# Patient Record
Sex: Male | Born: 1965 | Race: White | Hispanic: No | Marital: Married | Smoking: Never smoker
Health system: Southern US, Community
[De-identification: ages and names within clinical notes are randomized; demographics above are authoritative.]

---

## 2019-01-07 ENCOUNTER — Ambulatory Visit (HOSPITAL_BASED_OUTPATIENT_CLINIC_OR_DEPARTMENT_OTHER)
Admission: EM | Admit: 2019-01-07 | Discharge: 2019-01-08 | Disposition: A | Discharge status: Home / Self Care | Payer: Federal, State, Local not specified - PPO | Attending: Emergency Medicine | Admitting: Emergency Medicine

## 2019-01-07 ENCOUNTER — Emergency Department (HOSPITAL_BASED_OUTPATIENT_CLINIC_OR_DEPARTMENT_OTHER): Payer: Federal, State, Local not specified - PPO

## 2019-01-07 ENCOUNTER — Emergency Department (HOSPITAL_COMMUNITY): Payer: Federal, State, Local not specified - PPO | Admitting: Certified Registered"

## 2019-01-07 ENCOUNTER — Other Ambulatory Visit (HOSPITAL_BASED_OUTPATIENT_CLINIC_OR_DEPARTMENT_OTHER): Payer: Self-pay

## 2019-01-07 ENCOUNTER — Encounter (HOSPITAL_COMMUNITY)
Admission: EM | Disposition: A | Discharge status: Home / Self Care | Payer: Self-pay | Source: Home / Self Care | Attending: Emergency Medicine

## 2019-01-07 ENCOUNTER — Encounter (HOSPITAL_BASED_OUTPATIENT_CLINIC_OR_DEPARTMENT_OTHER): Payer: Self-pay | Admitting: Emergency Medicine

## 2019-01-07 ENCOUNTER — Other Ambulatory Visit: Payer: Self-pay

## 2019-01-07 ENCOUNTER — Other Ambulatory Visit (HOSPITAL_BASED_OUTPATIENT_CLINIC_OR_DEPARTMENT_OTHER): Payer: Self-pay | Admitting: Radiology

## 2019-01-07 DIAGNOSIS — Z1159 Encounter for screening for other viral diseases: Secondary | ICD-10-CM | POA: Insufficient documentation

## 2019-01-07 DIAGNOSIS — E7800 Pure hypercholesterolemia, unspecified: Secondary | ICD-10-CM | POA: Diagnosis not present

## 2019-01-07 DIAGNOSIS — T148XXA Other injury of unspecified body region, initial encounter: Secondary | ICD-10-CM | POA: Diagnosis present

## 2019-01-07 DIAGNOSIS — S61313A Laceration without foreign body of left middle finger with damage to nail, initial encounter: Secondary | ICD-10-CM | POA: Insufficient documentation

## 2019-01-07 DIAGNOSIS — S61412A Laceration without foreign body of left hand, initial encounter: Secondary | ICD-10-CM

## 2019-01-07 DIAGNOSIS — S61215A Laceration without foreign body of left ring finger without damage to nail, initial encounter: Secondary | ICD-10-CM | POA: Diagnosis not present

## 2019-01-07 DIAGNOSIS — S62663B Nondisplaced fracture of distal phalanx of left middle finger, initial encounter for open fracture: Secondary | ICD-10-CM | POA: Insufficient documentation

## 2019-01-07 DIAGNOSIS — X58XXXA Exposure to other specified factors, initial encounter: Secondary | ICD-10-CM | POA: Diagnosis not present

## 2019-01-07 DIAGNOSIS — S62639A Displaced fracture of distal phalanx of unspecified finger, initial encounter for closed fracture: Secondary | ICD-10-CM

## 2019-01-07 DIAGNOSIS — S61219A Laceration without foreign body of unspecified finger without damage to nail, initial encounter: Secondary | ICD-10-CM | POA: Diagnosis present

## 2019-01-07 HISTORY — PX: I & D EXTREMITY: SHX5045

## 2019-01-07 LAB — CBC WITH DIFFERENTIAL/PLATELET
Abs Immature Granulocytes: 0.02 10*3/uL (ref 0.00–0.07)
Basophils Absolute: 0 10*3/uL (ref 0.0–0.1)
Basophils Relative: 1 %
Eosinophils Absolute: 0.1 10*3/uL (ref 0.0–0.5)
Eosinophils Relative: 1 %
HCT: 41.9 % (ref 39.0–52.0)
Hemoglobin: 13.5 g/dL (ref 13.0–17.0)
Immature Granulocytes: 0 %
Lymphocytes Relative: 48 %
Lymphs Abs: 3.9 10*3/uL (ref 0.7–4.0)
MCH: 25.3 pg — ABNORMAL LOW (ref 26.0–34.0)
MCHC: 32.2 g/dL (ref 30.0–36.0)
MCV: 78.6 fL — ABNORMAL LOW (ref 80.0–100.0)
Monocytes Absolute: 0.6 10*3/uL (ref 0.1–1.0)
Monocytes Relative: 8 %
Neutro Abs: 3.4 10*3/uL (ref 1.7–7.7)
Neutrophils Relative %: 42 %
Platelets: 214 10*3/uL (ref 150–400)
RBC: 5.33 MIL/uL (ref 4.22–5.81)
RDW: 12.7 % (ref 11.5–15.5)
WBC: 8.1 10*3/uL (ref 4.0–10.5)
nRBC: 0 % (ref 0.0–0.2)

## 2019-01-07 LAB — COMPREHENSIVE METABOLIC PANEL
ALT: 28 U/L (ref 0–44)
AST: 31 U/L (ref 15–41)
Albumin: 5 g/dL (ref 3.5–5.0)
Alkaline Phosphatase: 34 U/L — ABNORMAL LOW (ref 38–126)
Anion gap: 11 (ref 5–15)
BUN: 27 mg/dL — ABNORMAL HIGH (ref 6–20)
CO2: 23 mmol/L (ref 22–32)
Calcium: 9.7 mg/dL (ref 8.9–10.3)
Chloride: 104 mmol/L (ref 98–111)
Creatinine, Ser: 1.26 mg/dL — ABNORMAL HIGH (ref 0.61–1.24)
GFR calc Af Amer: >60 mL/min (ref >60)
GFR calc non Af Amer: >60 mL/min (ref >60)
Glucose, Bld: 116 mg/dL — ABNORMAL HIGH (ref 70–99)
Potassium: 3.8 mmol/L (ref 3.5–5.1)
Sodium: 138 mmol/L (ref 135–145)
Total Bilirubin: 0.6 mg/dL (ref 0.3–1.2)
Total Protein: 7.7 g/dL (ref 6.5–8.1)

## 2019-01-07 LAB — SARS CORONAVIRUS 2 AG (30 MIN TAT): SARS Coronavirus 2 Ag: NEGATIVE

## 2019-01-07 SURGERY — IRRIGATION AND DEBRIDEMENT EXTREMITY
Anesthesia: General | Site: Hand | Laterality: Left

## 2019-01-07 MED ORDER — FENTANYL CITRATE (PF) 100 MCG/2ML IJ SOLN: 25.0000 ug | INTRAMUSCULAR | Status: DC | PRN

## 2019-01-07 MED ORDER — HYDROCODONE-ACETAMINOPHEN 5-325 MG PO TABS: ORAL_TABLET | ORAL | 0 refills | Status: AC

## 2019-01-07 MED ORDER — 0.9 % SODIUM CHLORIDE (POUR BTL) OPTIME
TOPICAL | Status: DC | PRN
  Administered 2019-01-07: 1000 mL

## 2019-01-07 MED ORDER — LACTATED RINGERS IV SOLN
INTRAVENOUS | Status: DC | PRN
  Administered 2019-01-07 (×2): via INTRAVENOUS

## 2019-01-07 MED ORDER — HYDROMORPHONE HCL 1 MG/ML IJ SOLN
1.0000 mg | Freq: Once | INTRAMUSCULAR | Status: AC
  Administered 2019-01-07: 1 mg via INTRAVENOUS

## 2019-01-07 MED ORDER — ONDANSETRON HCL 4 MG/2ML IJ SOLN
INTRAMUSCULAR | Status: AC
  Administered 2019-01-07: 4 mg via INTRAVENOUS
  Filled 2019-01-07: qty 2

## 2019-01-07 MED ORDER — PROPOFOL 10 MG/ML IV BOLUS
INTRAVENOUS | Status: DC | PRN
  Administered 2019-01-07: 170 mg via INTRAVENOUS

## 2019-01-07 MED ORDER — LIDOCAINE 2% (20 MG/ML) 5 ML SYRINGE
INTRAMUSCULAR | Status: DC | PRN
  Administered 2019-01-07: 100 mg via INTRAVENOUS

## 2019-01-07 MED ORDER — HYDROMORPHONE HCL 1 MG/ML IJ SOLN
INTRAMUSCULAR | Status: AC
  Administered 2019-01-07: 1 mg via INTRAVENOUS
  Filled 2019-01-07: qty 1

## 2019-01-07 MED ORDER — SODIUM CHLORIDE 0.9 % IV BOLUS
500.0000 mL | Freq: Once | INTRAVENOUS | Status: AC
  Administered 2019-01-07: 500 mL via INTRAVENOUS

## 2019-01-07 MED ORDER — BUPIVACAINE HCL (PF) 0.5 % IJ SOLN
30.0000 mL | Freq: Once | INTRAMUSCULAR | Status: AC
  Administered 2019-01-07: 30 mL
  Filled 2019-01-07: qty 30

## 2019-01-07 MED ORDER — BUPIVACAINE HCL (PF) 0.25 % IJ SOLN
INTRAMUSCULAR | Status: DC | PRN
  Administered 2019-01-07: 10 mL

## 2019-01-07 MED ORDER — DEXAMETHASONE SODIUM PHOSPHATE 10 MG/ML IJ SOLN
INTRAMUSCULAR | Status: DC | PRN
  Administered 2019-01-07: 10 mg via INTRAVENOUS

## 2019-01-07 MED ORDER — SUFENTANIL CITRATE 50 MCG/ML IV SOLN
INTRAVENOUS | Status: DC | PRN
  Administered 2019-01-07: 10 ug via INTRAVENOUS

## 2019-01-07 MED ORDER — ONDANSETRON HCL 4 MG/2ML IJ SOLN
4.0000 mg | Freq: Once | INTRAMUSCULAR | Status: AC
  Administered 2019-01-07: 4 mg via INTRAVENOUS

## 2019-01-07 MED ORDER — ONDANSETRON HCL 4 MG/2ML IJ SOLN
INTRAMUSCULAR | Status: DC | PRN
  Administered 2019-01-07: 4 mg via INTRAVENOUS

## 2019-01-07 MED ORDER — CEFAZOLIN SODIUM-DEXTROSE 2-4 GM/100ML-% IV SOLN
2.0000 g | Freq: Once | INTRAVENOUS | Status: DC
  Filled 2019-01-07: qty 100

## 2019-01-07 MED ORDER — MIDAZOLAM HCL 5 MG/5ML IJ SOLN
INTRAMUSCULAR | Status: DC | PRN
  Administered 2019-01-07: 2 mg via INTRAVENOUS

## 2019-01-07 MED ORDER — CEFAZOLIN SODIUM-DEXTROSE 2-3 GM-%(50ML) IV SOLR
INTRAVENOUS | Status: DC | PRN
  Administered 2019-01-07: 2 g via INTRAVENOUS

## 2019-01-07 MED ORDER — BUPIVACAINE HCL (PF) 0.25 % IJ SOLN
INTRAMUSCULAR | Status: AC
  Filled 2019-01-07: qty 30

## 2019-01-07 MED ORDER — SUCCINYLCHOLINE CHLORIDE 200 MG/10ML IV SOSY
PREFILLED_SYRINGE | INTRAVENOUS | Status: DC | PRN
  Administered 2019-01-07: 140 mg via INTRAVENOUS

## 2019-01-07 MED ORDER — CEFAZOLIN SODIUM-DEXTROSE 1-4 GM/50ML-% IV SOLN
1.0000 g | Freq: Once | INTRAVENOUS | Status: AC
  Administered 2019-01-07: 1 g via INTRAVENOUS
  Filled 2019-01-07: qty 50

## 2019-01-07 MED ORDER — SULFAMETHOXAZOLE-TRIMETHOPRIM 800-160 MG PO TABS: 1.0000 | ORAL_TABLET | Freq: Two times a day (BID) | ORAL | 0 refills | Status: AC

## 2019-01-07 SURGICAL SUPPLY — 53 items
BANDAGE ACE 3X5.8 VEL STRL LF (GAUZE/BANDAGES/DRESSINGS) ×3 IMPLANT
BANDAGE ACE 4X5 VEL STRL LF (GAUZE/BANDAGES/DRESSINGS) ×3 IMPLANT
BANDAGE COBAN STERILE 2 (GAUZE/BANDAGES/DRESSINGS) IMPLANT
BANDAGE ELASTIC 3 VELCRO ST LF (GAUZE/BANDAGES/DRESSINGS) IMPLANT
BANDAGE ELASTIC 4 VELCRO ST LF (GAUZE/BANDAGES/DRESSINGS) IMPLANT
BNDG COHESIVE 1X5 TAN STRL LF (GAUZE/BANDAGES/DRESSINGS) ×6 IMPLANT
BNDG ESMARK 4X9 LF (GAUZE/BANDAGES/DRESSINGS) ×3 IMPLANT
BNDG GAUZE ELAST 4 BULKY (GAUZE/BANDAGES/DRESSINGS) ×3 IMPLANT
CORDS BIPOLAR (ELECTRODE) ×3 IMPLANT
COVER SURGICAL LIGHT HANDLE (MISCELLANEOUS) ×3 IMPLANT
COVER WAND RF STERILE (DRAPES) ×3 IMPLANT
CUFF TOURNIQUET SINGLE 18IN (TOURNIQUET CUFF) IMPLANT
CUFF TOURNIQUET SINGLE 24IN (TOURNIQUET CUFF) ×3 IMPLANT
DECANTER SPIKE VIAL GLASS SM (MISCELLANEOUS) ×3 IMPLANT
DRAIN PENROSE 1/4X12 LTX STRL (WOUND CARE) IMPLANT
DRSG PAD ABDOMINAL 8X10 ST (GAUZE/BANDAGES/DRESSINGS) ×6 IMPLANT
GAUZE SPONGE 4X4 12PLY STRL (GAUZE/BANDAGES/DRESSINGS) ×3 IMPLANT
GAUZE SPONGE 4X4 12PLY STRL LF (GAUZE/BANDAGES/DRESSINGS) ×3 IMPLANT
GAUZE XEROFORM 1X8 LF (GAUZE/BANDAGES/DRESSINGS) ×3 IMPLANT
GLOVE BIO SURGEON STRL SZ7.5 (GLOVE) ×3 IMPLANT
GLOVE BIOGEL PI IND STRL 8 (GLOVE) ×1 IMPLANT
GLOVE BIOGEL PI INDICATOR 8 (GLOVE) ×2
GOWN STRL REUS W/ TWL LRG LVL3 (GOWN DISPOSABLE) ×1 IMPLANT
GOWN STRL REUS W/ TWL XL LVL3 (GOWN DISPOSABLE) ×1 IMPLANT
GOWN STRL REUS W/TWL LRG LVL3 (GOWN DISPOSABLE) ×2
GOWN STRL REUS W/TWL XL LVL3 (GOWN DISPOSABLE) ×2
KIT BASIN OR (CUSTOM PROCEDURE TRAY) ×3 IMPLANT
KIT TURNOVER KIT B (KITS) ×3 IMPLANT
LOOP VESSEL MAXI BLUE (MISCELLANEOUS) IMPLANT
MANIFOLD NEPTUNE II (INSTRUMENTS) IMPLANT
NEEDLE HYPO 25X1 1.5 SAFETY (NEEDLE) ×3 IMPLANT
NS IRRIG 1000ML POUR BTL (IV SOLUTION) ×3 IMPLANT
PACK ORTHO EXTREMITY (CUSTOM PROCEDURE TRAY) ×3 IMPLANT
PAD ARMBOARD 7.5X6 YLW CONV (MISCELLANEOUS) ×6 IMPLANT
SCRUB BETADINE 4OZ XXX (MISCELLANEOUS) ×3 IMPLANT
SET CYSTO W/LG BORE CLAMP LF (SET/KITS/TRAYS/PACK) IMPLANT
SOL PREP POV-IOD 4OZ 10% (MISCELLANEOUS) ×3 IMPLANT
SPLINT FINGER (SOFTGOODS) ×6 IMPLANT
SPONGE LAP 18X18 X RAY DECT (DISPOSABLE) ×3 IMPLANT
SPONGE LAP 4X18 RFD (DISPOSABLE) IMPLANT
SUT CHROMIC 6 0 PS 4 (SUTURE) ×3 IMPLANT
SUT ETHILON 4 0 P 3 18 (SUTURE) IMPLANT
SUT ETHILON 4 0 PS 2 18 (SUTURE) IMPLANT
SUT MON AB 5-0 P3 18 (SUTURE) ×6 IMPLANT
SWAB COLLECTION DEVICE MRSA (MISCELLANEOUS) IMPLANT
SWAB CULTURE ESWAB REG 1ML (MISCELLANEOUS) IMPLANT
SYR CONTROL 10ML LL (SYRINGE) ×3 IMPLANT
TOWEL OR 17X26 10 PK STRL BLUE (TOWEL DISPOSABLE) ×3 IMPLANT
TUBE CONNECTING 12'X1/4 (SUCTIONS) ×1
TUBE CONNECTING 12X1/4 (SUCTIONS) ×2 IMPLANT
TUBE FEEDING ENTERAL 5FR 16IN (TUBING) IMPLANT
UNDERPAD 30X30 (UNDERPADS AND DIAPERS) ×3 IMPLANT
YANKAUER SUCT BULB TIP NO VENT (SUCTIONS) ×3 IMPLANT

## 2019-01-07 NOTE — H&P (Addendum)
Tayvon Culley is an 53 y.o. male.   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  Reports no previous injury to left hand and no other injury at this time.  Had throbbing pain initially worsened with palpation and motion and relieved by digital block by ED.  Associated bleeding from wounds.  Case discussed with Dr. Judd Lien and his note from 01/07/2019 reviewed. Xrays viewed and interpreted by me: 3 views left hand show tuft fracture long finger.  No other fracture, dislocation, radioopaque foreign body noted. Labs reviewed: none  Allergies: No Known Allergies  No past medical history on file.  PMH: hypercholesterolemia Meds: fenofibrate, vitamin D, fish oil  Family History: No family history on file.  Social History:   reports that he has never smoked. He has never used smokeless tobacco. No history on file for alcohol and drug.  Medications: (Not in a hospital admission)   Results for orders placed or performed during the hospital encounter of 01/07/19 (from the past 48 hour(s))  CBC with Differential     Status: Abnormal   Collection Time: 01/07/19  5:20 PM  Result Value Ref Range   WBC 8.1 4.0 - 10.5 K/uL   RBC 5.33 4.22 - 5.81 MIL/uL   Hemoglobin 13.5 13.0 - 17.0 g/dL   HCT 16.1 09.6 - 04.5 %   MCV 78.6 (L) 80.0 - 100.0 fL   MCH 25.3 (L) 26.0 - 34.0 pg   MCHC 32.2 30.0 - 36.0 g/dL   RDW 40.9 81.1 - 91.4 %   Platelets 214 150 - 400 K/uL   nRBC 0.0 0.0 - 0.2 %   Neutrophils Relative % 42 %   Neutro Abs 3.4 1.7 - 7.7 K/uL   Lymphocytes Relative 48 %   Lymphs Abs 3.9 0.7 - 4.0 K/uL   Monocytes Relative 8 %   Monocytes Absolute 0.6 0.1 - 1.0 K/uL   Eosinophils Relative 1 %   Eosinophils Absolute 0.1 0.0 - 0.5 K/uL   Basophils Relative 1 %   Basophils Absolute 0.0 0.0 - 0.1 K/uL   Immature Granulocytes 0 %   Abs Immature Granulocytes 0.02 0.00 - 0.07 K/uL    Comment:  Performed at [facility], [address] [place], [place] [address]  Comprehensive metabolic panel     Status: Abnormal   Collection Time: 01/07/19  5:20 PM  Result Value Ref Range   Sodium 138 135 - 145 mmol/L   Potassium 3.8 3.5 - 5.1 mmol/L   Chloride 104 98 - 111 mmol/L   CO2 23 22 - 32 mmol/L   Glucose, Bld 116 (H) 70 - 99 mg/dL   BUN 27 (H) 6 - 20 mg/dL   Creatinine, Ser 6.21 (H) 0.61 - 1.24 mg/dL   Calcium 9.7 8.9 - 30.8 mg/dL   Total Protein 7.7 6.5 - 8.1 g/dL   Albumin 5.0 3.5 - 5.0 g/dL   AST 31 15 - 41 U/L   ALT 28 0 - 44 U/L   Alkaline Phosphatase 34 (L) 38 - 126 U/L   Total Bilirubin 0.6 0.3 - 1.2 mg/dL   GFR calc non Af Amer >60 >60 mL/min   GFR calc Af Amer >60 >60 mL/min   Anion gap 11 5 - 15    Comment: Performed at [facility], [address] [place], [place] [address]  SARS Coronavirus 2 [facility]  order,Performed in [facility] lab via [employer] [facility])     Status: None   Collection Time: 01/07/19  7:38 PM  Result Value Ref Range   SARS Coronavirus 2 ([facility] Now) NEGATIVE NEGATIVE    Comment: (NOTE) Interpretive Result Comment(s): COVID 19 Positive SARS CoV 2 target nucleic acids are DETECTED. The SARS CoV 2 RNA is generally detectable in upper and lower respiratory specimens during the acute phase of infection.  Positive results are indicative of active infection with SARS CoV 2.  Clinical correlation with patient history and other diagnostic information is necessary to determine patient infection status.  Positive results do not rule out bacterial infection or coinfection with other viruses. The expected result is Negative. COVID 19 Negative SARS CoV 2 target nucleic acids are NOT DETECTED. The SARS CoV 2 RNA is generally detectable in upper and lower respiratory specimens during the acute phase of infection.  Negative results do not preclude SARS CoV 2 infection, do not rule out coinfections with other pathogens, and should not be used  as the sole basis for treatment or other patient management decisions.  Negative results must be combined with clinical  observations, patient history, and epidemiological information. The expected result is Negative. Invalid Presence or absence of SARS CoV 2 nucleic acids cannot be determined. Repeat testing was performed on the submitted specimen and repeated Invalid results were obtained.  If clinically indicated, additional testing on a new specimen with an alternate test methodology [phone] is advised.  The SARS CoV 2 RNA is generally detectable in upper and lower respiratory specimens during the acute phase of infection. The expected result is Negative. Fact Sheet for Patients:  http://www.graves-ford.org/https://www.fda.gov/media/136524/download Fact Sheet for Healthcare Providers: EnviroConcern.sihttps://www.fda.gov/media/136523/download This test is not yet approved or cleared by the [place] FDA and has been authorized for detection and/or diagnosis of SARS CoV 2 by FDA under an Emergency Use Authorization (EUA).  This EUA will remain in effect (meaning this test can be used) for the duration of the COVID19 d eclaration under Section 564(b)(1) of the Act, 21 U.S.C. section [phone] 3(b)(1), unless the authorization is terminated or revoked sooner. Performed at [facility] [place], [address] [place], [place] [address]     Dg Hand Complete Left  Result Date: 01/07/2019 CLINICAL DATA:  53 year old male status post lawnmower injury to left hand. EXAM: LEFT HAND - COMPLETE 3+ VIEW COMPARISON:  None. FINDINGS: Soft tissue injuries at the distal 3rd and 4th fingers, partial amputation at the latter. There is a nondisplaced fracture through the tuft of the 3rd distal phalanx. However, the 4th distal phalanx appears to remain intact. No radiopaque foreign body identified. Other visible left hand osseous structures appear intact and normal. IMPRESSION: 1. Nondisplaced fracture through the tuft of the 3rd distal  phalanx. 2. Soft tissue injury to the distal 3rd and 4th fingers. Electronically Signed   By: Odessa FlemingH  Hall M.D.   On: 01/07/2019 18:57     A comprehensive review of systems was negative. Review of Systems: No fevers, chills, night sweats, chest pain, shortness of breath, nausea, vomiting, diarrhea, constipation, easy bleeding or bruising, headaches, dizziness, vision changes, fainting.   Blood pressure 120/83, pulse 94, temperature 97.9 F (36.6 C), temperature source Oral, resp. rate 18, height 5\' 11"  (1.803 m), weight 88.5 kg, SpO2 100 %.  General appearance: alert, cooperative and appears stated age Head: Normocephalic, without obvious abnormality, atraumatic Neck: supple, symmetrical, trachea midline Resp: clear to auscultation bilaterally Cardio: regular rate  and rhythm Extremities: Intact sensation and capillary refill all digits except left long and ring with decreased sensation due to digital block.  +epl/fpl/io.  Lacerations to hyponychium and pad of left long and ring fingers. Able to flex at dip joint. Pulses: 2+ and symmetric Skin: Skin color, texture, turgor normal. No rashes or lesions Neurologic: Grossly normal Incision/Wound: as above  Assessment/Plan Left long and ring finger lawnmower injury with lacerations of pad and nail.  Small tuft fracture long finger.  Recommend OR for irrigation and debridement of wounds with repair of skin and nailbed lacerations.  Discussed possible need to shorten bone if bone exposed and unable to get coverage with soft tissue.  Risks, benefits and alternatives of surgery were discussed including risks of blood loss, infection, damage to nerves/vessels/tendons/ligament/bone, failure of surgery, need for additional surgery, complication with wound healing, stiffness, necrosis of skin.  He voiced understanding of these risks and elected to proceed.    Betha Loa 01/07/2019, 9:39 PM

## 2019-01-07 NOTE — Transfer of Care (Signed)
Immediate Anesthesia Transfer of Care Note  Patient: Henry Savage  Procedure(s) Performed: IRRIGATION AND DEBRIDEMENT LEFT LONG AND RING FINGER AND REPAIR WOUNDS (Left Hand)  Patient Location: PACU  Anesthesia Type:General  Level of Consciousness: sedated, drowsy, patient cooperative and responds to stimulation  Airway & Oxygen Therapy: Patient Spontanous Breathing and Patient connected to nasal cannula oxygen  Post-op Assessment: Report given to RN, Post -op Vital signs reviewed and stable and Patient moving all extremities X 4  Post vital signs: Reviewed and stable  Last Vitals:  Vitals Value Taken Time  BP 118/79 01/07/2019 11:04 PM  Temp    Pulse 76 01/07/2019 11:05 PM  Resp 8 01/07/2019 11:05 PM  SpO2 97 % 01/07/2019 11:05 PM  Vitals shown include unvalidated device data.  Last Pain:  Vitals:   01/07/19 2126  TempSrc:   PainSc: 8          Complications: No apparent anesthesia complications

## 2019-01-07 NOTE — Anesthesia Postprocedure Evaluation (Signed)
Anesthesia Post Note  Patient: Fish farm manager  Procedure(s) Performed: IRRIGATION AND DEBRIDEMENT LEFT LONG AND RING FINGER AND REPAIR WOUNDS (Left Hand)     Patient location during evaluation: PACU Anesthesia Type: General Level of consciousness: awake Pain management: pain level controlled Vital Signs Assessment: post-procedure vital signs reviewed and stable Respiratory status: spontaneous breathing Cardiovascular status: stable Postop Assessment: no apparent nausea or vomiting Anesthetic complications: no    Last Vitals:  Vitals:   01/07/19 2305 01/07/19 2315  BP: 118/79   Pulse: 76 95  Resp: (!) 8 (!) 22  Temp: (!) 36.3 C   SpO2: 97% 99%    Last Pain:  Vitals:   01/07/19 2315  TempSrc:   PainSc: 0-No pain                 Desirey Keahey

## 2019-01-07 NOTE — Anesthesia Preprocedure Evaluation (Signed)
Anesthesia Evaluation  Patient identified by MRN, date of birth, ID band Patient awake    Reviewed: Allergy & Precautions, NPO status , Patient's Chart, lab work & pertinent test results  Airway Mallampati: II  TM Distance: >3 FB     Dental   Pulmonary    breath sounds clear to auscultation       Cardiovascular negative cardio ROS   Rhythm:Regular Rate:Normal     Neuro/Psych    GI/Hepatic negative GI ROS, Neg liver ROS,   Endo/Other  negative endocrine ROS  Renal/GU negative Renal ROS     Musculoskeletal   Abdominal   Peds  Hematology   Anesthesia Other Findings   Reproductive/Obstetrics                             Anesthesia Physical Anesthesia Plan  ASA: II  Anesthesia Plan: General   Post-op Pain Management:    Induction: Intravenous  PONV Risk Score and Plan: 2 and Ondansetron and Midazolam  Airway Management Planned: Oral ETT  Additional Equipment:   Intra-op Plan:   Post-operative Plan: Extubation in OR  Informed Consent: I have reviewed the patients History and Physical, chart, labs and discussed the procedure including the risks, benefits and alternatives for the proposed anesthesia with the patient or authorized representative who has indicated his/her understanding and acceptance.     Dental advisory given  Plan Discussed with: CRNA and Anesthesiologist  Anesthesia Plan Comments:         Anesthesia Quick Evaluation

## 2019-01-07 NOTE — ED Provider Notes (Signed)
[facility] EMERGENCY DEPARTMENT Provider Note   CSN: [id number] Arrival date & time: 01/07/19  1655  History   Chief Complaint Chief Complaint  Patient presents with  . Hand Injury   HPI Henry Savage is a 53 y.o. male with past medical history significant for hyperlipidemia who presents for evaluation of hand injury.  Patient was fixing his lawnmower and stuck his left hand to the digits.  Lawnmower then subsequently turned on and he sustained 2 lacerations to his fourth and third digit on his left upper extremity.  Patient with mild oozing bleeding on arrival.  Rates his pain a 10/10.  Denies decreased range of motion to his extremities.  Denies any upper respiratory symptoms known COVID exposures.  He is not on anticoagulation.  Pain constant since onset.  Described as throbbing and sharp.  Tetanus last updated less than 2 years ago.  History obtained from patient.  No interpreter was used.     HPI  History reviewed. No pertinent past medical history.  There are no active problems to display for this patient.   History reviewed. No pertinent surgical history.      Home Medications    Prior to Admission medications   Medication Sig Start Date End Date Taking? Authorizing Provider  HYDROcodone-acetaminophen (NORCO) 5-325 MG tablet 1-2 tabs po q6 hours prn pain 01/07/19   Betha LoaKuzma, Kevin, MD  sulfamethoxazole-trimethoprim (BACTRIM DS) 800-160 MG tablet Take 1 tablet by mouth 2 (two) times daily. 01/07/19   Betha LoaKuzma, Kevin, MD    Family History History reviewed. No pertinent family history.  Social History Social History   Tobacco Use  . Smoking status: Never Smoker  . Smokeless tobacco: Never Used  Substance Use Topics  . Alcohol use: Not on file  . Drug use: Not on file     Allergies   Patient has no known allergies.   Review of Systems Review of Systems  Constitutional: Negative.   HENT: Negative.   Respiratory: Negative.   Cardiovascular: Negative.    Gastrointestinal: Negative.   Genitourinary: Negative.   Musculoskeletal: Negative.   Skin: Positive for wound.  Neurological: Negative.   All other systems reviewed and are negative.   Physical Exam Updated Vital Signs BP 118/79 (BP Location: Right Wrist)   Pulse 95   Temp (!) 97.3 F (36.3 C)   Resp (!) 22   Ht 5\' 11"  (1.803 m)   Wt 88.5 kg   SpO2 99%   BMI 27.20 kg/m   Physical Exam Vitals signs and nursing note reviewed.  Constitutional:      General: He is not in acute distress.    Appearance: He is well-developed. He is not ill-appearing, toxic-appearing or diaphoretic.  HENT:     Head: Normocephalic and atraumatic.     Nose: Nose normal.     Mouth/Throat:     Mouth: Mucous membranes are moist.     Pharynx: Oropharynx is clear.  Eyes:     Pupils: Pupils are equal, round, and reactive to light.  Neck:     Musculoskeletal: Normal range of motion and neck supple.  Cardiovascular:     Rate and Rhythm: Normal rate and regular rhythm.     Pulses: Normal pulses.     Heart sounds: Normal heart sounds. No murmur. No friction rub. No gallop.   Pulmonary:     Effort: Pulmonary effort is normal. No respiratory distress.     Breath sounds: Normal breath sounds. No stridor. No wheezing, rhonchi or  rales.  Abdominal:     General: Bowel sounds are normal. There is no distension.     Palpations: Abdomen is soft.     Tenderness: There is no abdominal tenderness. There is no guarding or rebound.     Hernia: No hernia is present.  Musculoskeletal: Normal range of motion.     Comments: Moves all 4 extremities without difficulty.  Moves digits without difficulty.  Patient with obvious injury to distal third and fourth digit on left upper extremity.  He has tenderness to his third and fourth digit on his left upper extremity.  Skin:    General: Skin is warm and dry.     Comments: Laceration to third and fourth digit on left upper extremity with majority of pulp missing.  There is  mild oozing of blood.  No drainage.  Possible visualization of bone to fourth digit.  No pallor.  There is nailbed involvement with some nail missing to third and fourth digit.  Neurological:     Mental Status: He is alert.     Comments: Sensation intact to bilateral upper extremities. 5/5 strength bilateral upper extremities.  Ambulatory without difficulty.              ED Treatments / Results  Labs (all labs ordered are listed, but only abnormal results are displayed) Labs Reviewed  CBC WITH DIFFERENTIAL/PLATELET - Abnormal; Notable for the following components:      Result Value   MCV 78.6 (*)    MCH 25.3 (*)    All other components within normal limits  COMPREHENSIVE METABOLIC PANEL - Abnormal; Notable for the following components:   Glucose, Bld 116 (*)    BUN 27 (*)    Creatinine, Ser 1.26 (*)    Alkaline Phosphatase 34 (*)    All other components within normal limits  SARS CORONAVIRUS 2 (HOSP ORDER, PERFORMED IN [facility] VIA [facility])    EKG None  Radiology Dg Hand Complete Left  Result Date: 01/07/2019 CLINICAL DATA:  53 year old male status post lawnmower injury to left hand. EXAM: LEFT HAND - COMPLETE 3+ VIEW COMPARISON:  None. FINDINGS: Soft tissue injuries at the distal 3rd and 4th fingers, partial amputation at the latter. There is a nondisplaced fracture through the tuft of the 3rd distal phalanx. However, the 4th distal phalanx appears to remain intact. No radiopaque foreign body identified. Other visible left hand osseous structures appear intact and normal. IMPRESSION: 1. Nondisplaced fracture through the tuft of the 3rd distal phalanx. 2. Soft tissue injury to the distal 3rd and 4th fingers. Electronically Signed   By: Odessa Fleming M.D.   On: 01/07/2019 18:57    Procedures .Nerve Block Date/Time: 01/07/2019 7:28 PM Performed by: Linwood Dibbles, PA-C Authorized by: Linwood Dibbles, PA-C   Consent:    Consent obtained:  Verbal   Consent given  by:  Patient   Risks discussed:  Infection, intravenous injection, nerve damage, pain, swelling, unsuccessful block and bleeding   Alternatives discussed:  Delayed treatment, alternative treatment, referral and no treatment Indications:    Indications:  Pain relief and procedural anesthesia Location:    Body area:  Upper extremity   Upper extremity nerve blocked: Digit.   Laterality:  Left Pre-procedure details:    Skin preparation:  Povidone-iodine   Preparation: Patient was prepped and draped in usual sterile fashion   Skin anesthesia (see MAR for exact dosages):    Skin anesthesia method:  None Procedure details (see MAR for  exact dosages):    Block needle gauge:  25 G   Anesthetic injected:  Bupivacaine 0.5% w/o epi   Steroid injected:  None   Additive injected:  None   Injection procedure:  Anatomic landmarks identified   Paresthesia:  Immediately resolved Post-procedure details:    Dressing:  Sterile dressing   Outcome:  Pain relieved   Patient tolerance of procedure:  Tolerated well, no immediate complications   (including critical care time)  Medications Ordered in ED Medications  fentaNYL (SUBLIMAZE) injection 25-50 mcg (has no administration in time range)  ondansetron (ZOFRAN) injection 4 mg (4 mg Intravenous Given 01/07/19 1720)  HYDROmorphone (DILAUDID) injection 1 mg (1 mg Intravenous Given 01/07/19 1720)  bupivacaine (MARCAINE) 0.5 % injection 30 mL (30 mLs Infiltration Given 01/07/19 1943)  sodium chloride 0.9 % bolus 500 mL (0 mLs Intravenous Stopped 01/07/19 2005)  ceFAZolin (ANCEF) IVPB 1 g/50 mL premix (0 g Intravenous Stopped 01/07/19 2005)   Initial Impression / Assessment and Plan / ED Course  I have reviewed the triage vital signs and the nursing notes.  Pertinent labs & imaging results that were available during my care of the patient were reviewed by me and considered in my medical decision making (see chart for details).  53 year old male appears otherwise  well presents for evaluation of injury to the left upper extremity.  Afebrile, nonseptic, non-ill-appearing.  Patient with mild bleeding to third and fourth digit with extensive laceration with missing pulp to left upper extremity.  He is neurovascularly intact.  He has no musculoskeletal exam.  He is not on anticoagulation.  His tetanus is up-to-date.  His x-ray shows tuft fracture to third digit.  He was given Ancef for open fracture. Last PO intake at 12pm. Given extensive nature of laceration consulted with Dr. Merlyn Lot, hand surgery.  Recommends transfer to [facility] emergency department for evaluation with possible surgery.  Patient did receive digital block with significant relief in pain. Ring removed.  Labs and imaging personally reviewed. Reviewed prior records.  Consulted with Dr. Charm Barges from [facility] emergency department who agrees to accept patient in transfer.  Patient states he will only go by personal vehicle.  His neighbor agrees to transfer patient to medicine emergency department.  He is hemodynamically stable.  There is no active bleeding form wounds.  His wounds were wrapped prior to transfer.  Patient understands not to stop or eat prior arrival to ED.  Patient will non-go by EMS feel he does need evaluation by general surgery so he will proceed by personal vehicle with friend taking him.  Prior to transport patient with stable vital signs, no active bleeding.  No additional emergent medical conditions at this time.     Patient has been evaluated by attending, Dr. Judd Lien who agrees with the treatment, plan and disposition. Final Clinical Impressions(s) / ED Diagnoses   Final diagnoses:  Laceration of left hand without foreign body, initial encounter  Closed fracture of tuft of distal phalanx of finger  Open fracture    ED Discharge Orders         Ordered    HYDROcodone-acetaminophen (NORCO) 5-325 MG tablet     01/07/19 2300    sulfamethoxazole-trimethoprim (BACTRIM DS) 800-160 MG  tablet  2 times daily     01/07/19 2300           Henderly, Britni A, PA-C 01/07/19 2318    Geoffery Lyons, MD 01/08/19 2248

## 2019-01-07 NOTE — ED Notes (Addendum)
Removed ring from left ring finger with ring cutter

## 2019-01-07 NOTE — ED Triage Notes (Signed)
Pt put hand under lawn mower.  Pt has open laceration to left ring finger and left middle finger.  Pt has open fracture to left ring finger.

## 2019-01-07 NOTE — Anesthesia Procedure Notes (Signed)
Procedure Name: Intubation Date/Time: 01/07/2019 10:05 PM Performed by: Claris Che, CRNA Pre-anesthesia Checklist: Patient identified, Emergency Drugs available, Suction available, Patient being monitored and Timeout performed Patient Re-evaluated:Patient Re-evaluated prior to induction Oxygen Delivery Method: Circle system utilized Preoxygenation: Pre-oxygenation with 100% oxygen Induction Type: IV induction, Rapid sequence and Cricoid Pressure applied Ventilation: Mask ventilation without difficulty Laryngoscope Size: Mac and 3 Grade View: Grade II Tube type: Oral Tube size: 7.5 mm Number of attempts: 1 Airway Equipment and Method: Stylet Placement Confirmation: ETT inserted through vocal cords under direct vision,  positive ETCO2 and breath sounds checked- equal and bilateral Secured at: 23 cm Tube secured with: Tape Dental Injury: Teeth and Oropharynx as per pre-operative assessment

## 2019-01-07 NOTE — Discharge Instructions (Signed)

## 2019-01-07 NOTE — Op Note (Signed)
NAME: Henry Savage MEDICAL RECORD NO: [id number] DATE OF BIRTH: 07-30-1966 FACILITY: [facility] LOCATION: [facility] OR PHYSICIAN: Tami Ribas, MD   OPERATIVE REPORT   DATE OF PROCEDURE: 01/07/19    PREOPERATIVE DIAGNOSIS:   Left long and ring fingertip lawnmower injury   POSTOPERATIVE DIAGNOSIS:   1.  Left long finger skin and nailbed lacerations and distal phalanx tuft fracture 2.  Left ring finger skin lacerations   PROCEDURE:   1.  Left long finger repair of intermediate skin laceration approximately 2 cm 2.  Left long finger repair of nailbed laceration 3.  Left ring finger repair intermediate lacerations totaling approximately 4 to 5 cm   SURGEON:  Betha Loa, M.D.   ASSISTANT: none   ANESTHESIA:  General   INTRAVENOUS FLUIDS:  Per anesthesia flow sheet.   ESTIMATED BLOOD LOSS:  Minimal.   COMPLICATIONS:  None.   SPECIMENS:  none   TOURNIQUET TIME:    Total Tourniquet Time Documented: Upper Arm (Left) - 34 minutes Total: Upper Arm (Left) - 34 minutes    DISPOSITION:  Stable to PACU.   INDICATIONS: 53 year old male states he sustained an injury to left long and ring fingertips with his lawnmower.  He was seen at [facility] radiographs were taken of a tuft fracture of the long finger.  He had skin lacerations of the pads of the long and ring fingers.  He was transferred to [facility] for further care.  Recommended irrigation debridement of the wounds and repair of skin and nailbed lacerations in the operating room. Risks, benefits and alternatives of surgery were discussed including the risks of blood loss, infection, damage to nerves, vessels, tendons, ligaments, bone for surgery, need for additional surgery, complications with wound healing, continued pain, nonunion, malunion, stiffness.  He voiced understanding of these risks and elected to proceed.  OPERATIVE COURSE:  After being identified preoperatively by myself,  the patient and I agreed on the procedure  and site of the procedure.  The surgical site was marked.  Surgical consent had been signed. He was given IV antibiotics as preoperative antibiotic prophylaxis. He was transferred to the operating room and placed on the operating table in supine position with the Left upper extremity on an arm board.  General anesthesia was induced by the anesthesiologist.  Left upper extremity was prepped and draped in normal sterile orthopedic fashion.  A surgical pause was performed between the surgeons, anesthesia, and operating room staff and all were in agreement as to the patient, procedure, and site of procedure.  Tourniquet at the proximal aspect of the extremity was inflated to 250 mmHg after exsanguination of the arm with an Esmarch bandage.    The wounds were explored.  There is a small amount of gross contamination with what appeared to be bits of grass.  This was removed with the pickups.  Devitalized dermal edge was removed sharply with the scissors.  The nails of the long and ring fingers were removed with a freer elevator.  There was laceration of the long finger nailbed distally.  The laceration in the pad of the long finger was a single flap based at the radial side.  The laceration of the ring finger was more complex with multiple different lacerations.  The wounds were copiously irrigated with sterile saline by bulb syringe.  The laceration of the long finger was repaired with a 5-0 Monocryl suture in an interrupted fashion.  The nailbed laceration was later repaired with a 6-0 chromic suture  in an interrupted fashion.  Good reapproximation of soft tissue was obtained.  In the ring finger the 5-0 Monocryl suture was again used in an interrupted fashion to reapproximate all skin edges.  Good reapproximation and contour to the finger was obtained.  Xeroform was placed in the nail folds and the wounds dressed with sterile Xeroform 4 x 4's and wrapped with Coban dressing lightly.  AlumaFoam splints were placed and  wrapped lightly with Coban dressing.  Digital block had been performed with quarter percent plain Marcaine to aid in postoperative analgesia.  The tourniquet was deflated at 34 minutes.  Fingertips were pink with brisk capillary refill after deflation of tourniquet.  The operative  drapes were broken down.  The patient was awoken from anesthesia safely.  He was transferred back to the stretcher and taken to PACU in stable condition.  I will see him back in the office in 1 week for postoperative followup.  I will give him a prescription for Norco 5/325 1-2 tabs PO q6 hours prn pain, dispense # 20 and And Bactrim DS 1 p.o. twice daily x7 days.   Betha LoaKevin Logen Fowle, MD Electronically signed, 01/07/19

## 2019-01-07 NOTE — ED Notes (Signed)
Covered and bandaged open digital wounds on patient left hand per provider. Also covered IV left in place for POV transport to [facility] ED.

## 2019-01-08 ENCOUNTER — Encounter (HOSPITAL_COMMUNITY): Payer: Self-pay | Admitting: Orthopedic Surgery

## 2020-02-13 IMAGING — DX LEFT HAND - COMPLETE 3+ VIEW
3 series · 3 of 3 positions shown · non-contrast
Comparison: None.

CLINICAL DATA: 53-year-old male status post lawnmower injury to
left hand.

EXAM:
LEFT HAND - COMPLETE 3+ VIEW

[hand ap]
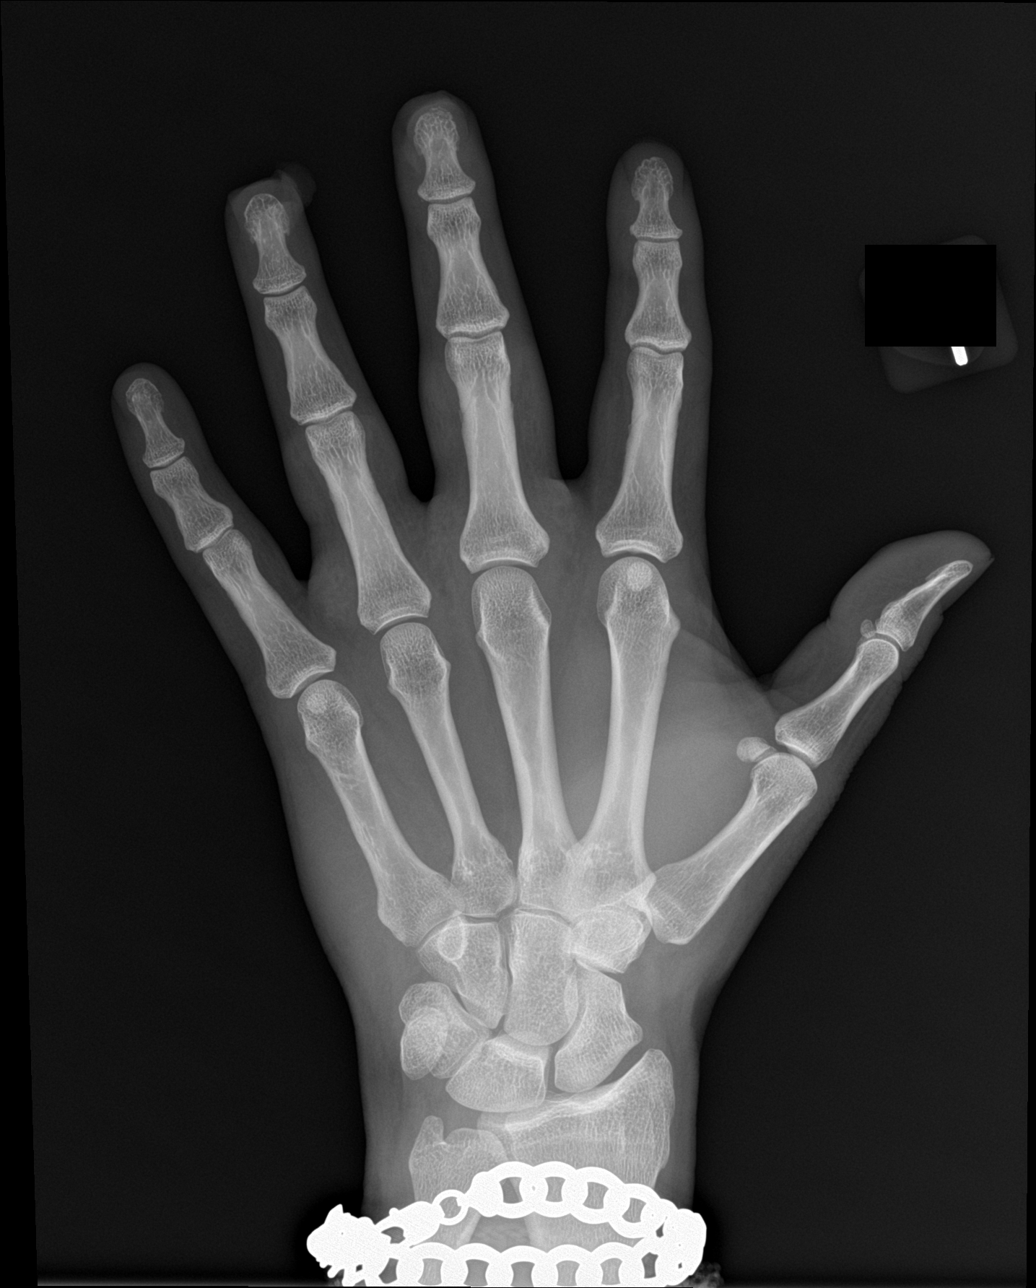

[hand obl]
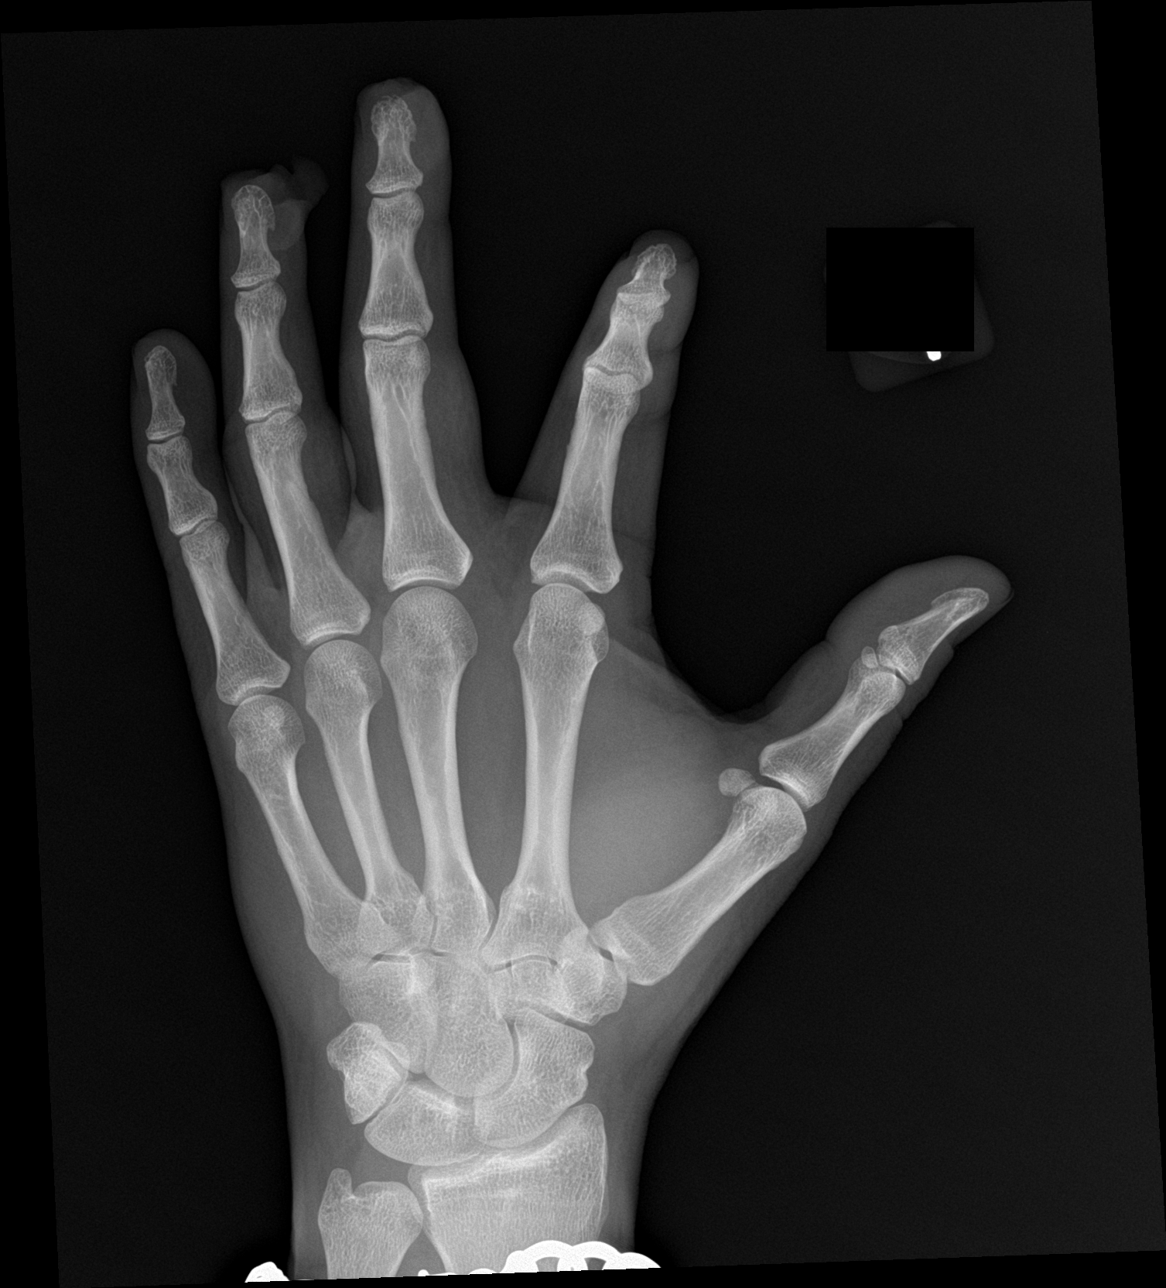

[hand lat]
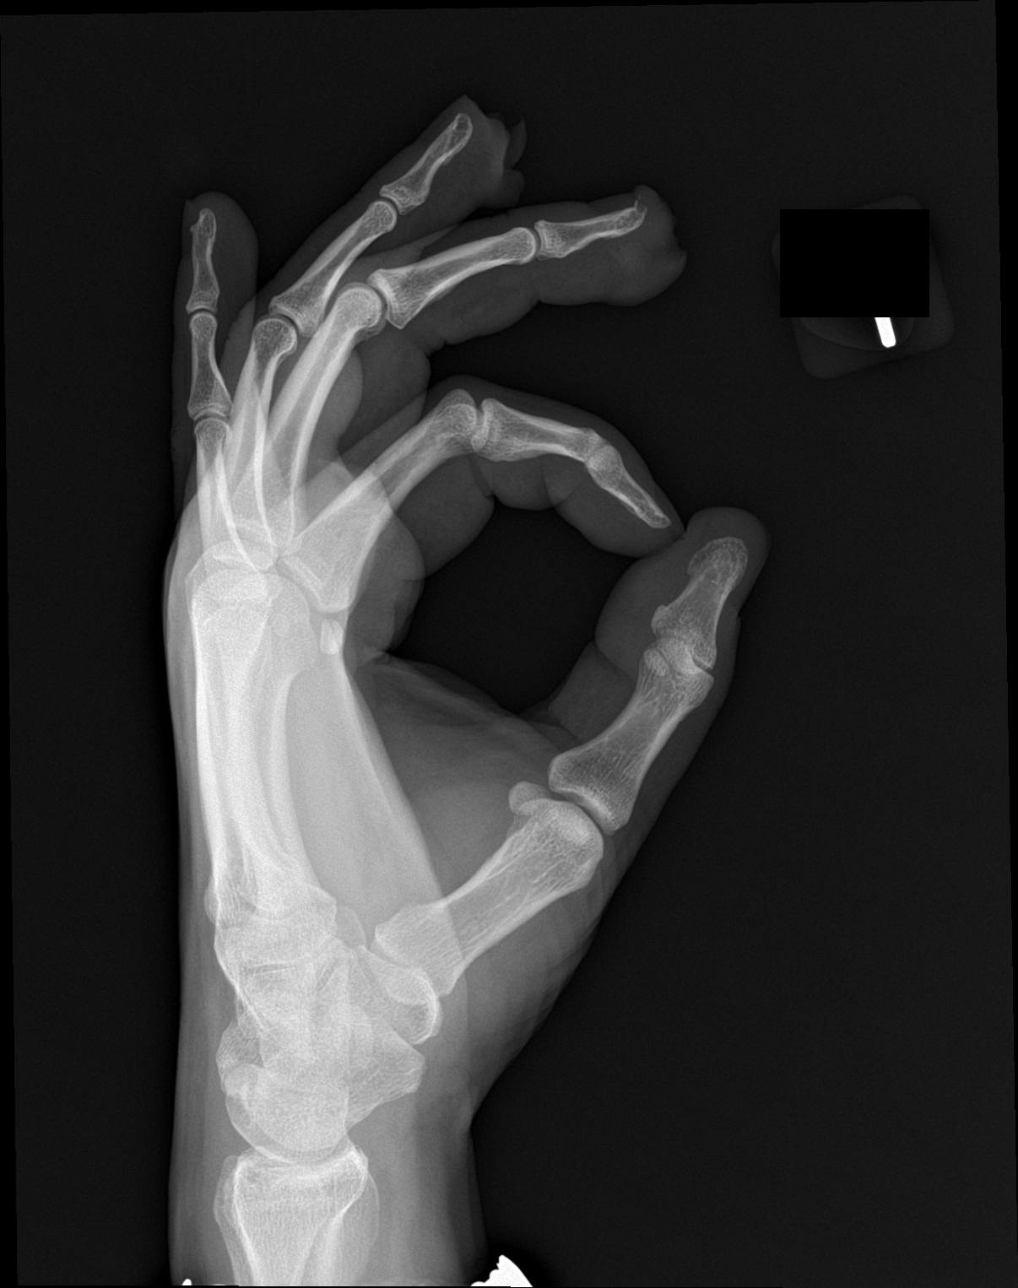

[3 of 3 positions shown; findings below may reference images not displayed]

FINDINGS: Soft tissue injuries at the distal 3rd and 4th fingers, partial
amputation at the latter. There is a nondisplaced fracture through
the tuft of the 3rd distal phalanx. However, the 4th distal phalanx
appears to remain intact.

No radiopaque foreign body identified. Other visible left hand
osseous structures appear intact and normal.
IMPRESSION: 1. Nondisplaced fracture through the tuft of the 3rd distal phalanx.
2. Soft tissue injury to the distal 3rd and 4th fingers.
